# Patient Record
Sex: Male | Born: 1990 | Race: White | Hispanic: No | Marital: Single | Smoking: Never smoker
Health system: Southern US, Community
[De-identification: ages and names within clinical notes are randomized; demographics above are authoritative.]

## PROBLEM LIST (undated history)

## (undated) DIAGNOSIS — G43909 Migraine, unspecified, not intractable, without status migrainosus: Secondary | ICD-10-CM

## (undated) DIAGNOSIS — J309 Allergic rhinitis, unspecified: Secondary | ICD-10-CM

## (undated) HISTORY — DX: Migraine, unspecified, not intractable, without status migrainosus: G43.909

## (undated) HISTORY — DX: Allergic rhinitis, unspecified: J30.9

---
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
## 2013-12-09 ENCOUNTER — Telehealth: Payer: Self-pay | Admitting: Internal Medicine

## 2013-12-09 NOTE — Telephone Encounter (Signed)
Rec'd from [facility] forward 4 pages  to Dr. John °

## 2014-07-09 ENCOUNTER — Encounter: Payer: Self-pay | Admitting: Internal Medicine

## 2014-07-09 ENCOUNTER — Other Ambulatory Visit (INDEPENDENT_AMBULATORY_CARE_PROVIDER_SITE_OTHER): Payer: Managed Care, Other (non HMO)

## 2014-07-09 ENCOUNTER — Ambulatory Visit (INDEPENDENT_AMBULATORY_CARE_PROVIDER_SITE_OTHER): Payer: Managed Care, Other (non HMO) | Admitting: Internal Medicine

## 2014-07-09 ENCOUNTER — Ambulatory Visit (INDEPENDENT_AMBULATORY_CARE_PROVIDER_SITE_OTHER)
Admission: RE | Admit: 2014-07-09 | Discharge: 2014-07-09 | Disposition: A | Discharge status: Home / Self Care | Payer: Managed Care, Other (non HMO) | Source: Ambulatory Visit | Attending: Internal Medicine | Admitting: Internal Medicine

## 2014-07-09 VITALS — BP 124/84 | HR 52 | Temp 98.0°F | Ht 70.0 in | Wt 177.8 lb

## 2014-07-09 DIAGNOSIS — Z0189 Encounter for other specified special examinations: Secondary | ICD-10-CM

## 2014-07-09 DIAGNOSIS — M25571 Pain in right ankle and joints of right foot: Secondary | ICD-10-CM | POA: Insufficient documentation

## 2014-07-09 DIAGNOSIS — Z0000 Encounter for general adult medical examination without abnormal findings: Secondary | ICD-10-CM | POA: Diagnosis not present

## 2014-07-09 LAB — URINALYSIS, ROUTINE W REFLEX MICROSCOPIC
BILIRUBIN URINE: NEGATIVE
Hgb urine dipstick: NEGATIVE
Ketones, ur: NEGATIVE
Leukocytes, UA: NEGATIVE
Nitrite: NEGATIVE
PH: 6 (ref 5.0–8.0)
RBC / HPF: NONE SEEN (ref 0–?)
Specific Gravity, Urine: <=1.005 — AB (ref 1.000–1.030)
Total Protein, Urine: NEGATIVE
URINE GLUCOSE: NEGATIVE
UROBILINOGEN UA: 0.2 (ref 0.0–1.0)
WBC UA: NONE SEEN (ref 0–?)

## 2014-07-09 LAB — CBC WITH DIFFERENTIAL/PLATELET
BASOS ABS: 0.1 10*3/uL (ref 0.0–0.1)
Basophils Relative: 0.9 % (ref 0.0–3.0)
EOS PCT: 1.6 % (ref 0.0–5.0)
Eosinophils Absolute: 0.1 10*3/uL (ref 0.0–0.7)
HCT: 44 % (ref 39.0–52.0)
HEMOGLOBIN: 15.4 g/dL (ref 13.0–17.0)
LYMPHS PCT: 17.9 % (ref 12.0–46.0)
Lymphs Abs: 1.3 10*3/uL (ref 0.7–4.0)
MCHC: 35 g/dL (ref 30.0–36.0)
MCV: 87.1 fl (ref 78.0–100.0)
MONO ABS: 0.5 10*3/uL (ref 0.1–1.0)
MONOS PCT: 6.9 % (ref 3.0–12.0)
NEUTROS ABS: 5.4 10*3/uL (ref 1.4–7.7)
Neutrophils Relative %: 72.7 % (ref 43.0–77.0)
PLATELETS: 257 10*3/uL (ref 150.0–400.0)
RBC: 5.05 Mil/uL (ref 4.22–5.81)
RDW: 12.5 % (ref 11.5–15.5)
WBC: 7.5 10*3/uL (ref 4.0–10.5)

## 2014-07-09 LAB — LIPID PANEL
Cholesterol: 193 mg/dL (ref 0–200)
HDL: 59.4 mg/dL (ref >39.00)
LDL CALC: 107 mg/dL — AB (ref 0–99)
NONHDL: 133.6
Total CHOL/HDL Ratio: 3
Triglycerides: 134 mg/dL (ref 0.0–149.0)
VLDL: 26.8 mg/dL (ref 0.0–40.0)

## 2014-07-09 LAB — HEPATIC FUNCTION PANEL
ALBUMIN: 4.8 g/dL (ref 3.5–5.2)
ALK PHOS: 69 U/L (ref 39–117)
ALT: 25 U/L (ref 0–53)
AST: 30 U/L (ref 0–37)
Bilirubin, Direct: 0.1 mg/dL (ref 0.0–0.3)
Total Bilirubin: 0.7 mg/dL (ref 0.2–1.2)
Total Protein: 7.8 g/dL (ref 6.0–8.3)

## 2014-07-09 LAB — BASIC METABOLIC PANEL
BUN: 17 mg/dL (ref 6–23)
CO2: 30 meq/L (ref 19–32)
Calcium: 9.9 mg/dL (ref 8.4–10.5)
Chloride: 99 mEq/L (ref 96–112)
Creatinine, Ser: 0.95 mg/dL (ref 0.40–1.50)
GFR: 105.35 mL/min (ref >60.00)
Glucose, Bld: 93 mg/dL (ref 70–99)
POTASSIUM: 3.8 meq/L (ref 3.5–5.1)
SODIUM: 135 meq/L (ref 135–145)

## 2014-07-09 LAB — TSH: TSH: 3.07 u[IU]/mL (ref 0.35–4.50)

## 2014-07-09 NOTE — Assessment & Plan Note (Signed)
Ok for right ankle film - r/o avulsion fx

## 2014-07-09 NOTE — Patient Instructions (Signed)
Please continue all other medications as before, and refills have been done if requested.  Please have the pharmacy call with any other refills you may need.  Please continue your efforts at being more active, low cholesterol diet, and weight control.  You are otherwise up to date with prevention measures today.  Please keep your appointments with your specialists as you may have planned  Please go to the XRAY Department in the Basement (go straight as you get off the elevator) for the x-ray testing  Please go to the LAB in the Basement (turn left off the elevator) for the tests to be done today  You will be contacted by phone if any changes need to be made immediately.  Otherwise, you will receive a letter about your results with an explanation, but please check with MyChart first.  Please remember to sign up for MyChart if you have not done so, as this will be important to you in the future with finding out test results, communicating by private email, and scheduling acute appointments online when needed.  

## 2014-07-09 NOTE — Progress Notes (Signed)
Pre visit review using our clinic review tool, if applicable. No additional management support is needed unless otherwise documented below in the visit note. 

## 2014-07-09 NOTE — Assessment & Plan Note (Signed)

## 2014-07-09 NOTE — Progress Notes (Signed)
Subjective:    Patient ID: Jonetta SpeakNicholas R Devora, male    DOB: 02/24/1991, 24 y.o.   MRN: [id number]  HPI  Here for wellness and f/u;  Overall doing ok;  Pt denies Chest pain, worsening SOB, DOE, wheezing, orthopnea, PND, worsening LE edema, palpitations, dizziness or syncope.  Pt denies neurological change such as new headache, facial or extremity weakness.  Pt denies polydipsia, polyuria, or low sugar symptoms. Pt states overall good compliance with treatment and medications, good tolerability, and has been trying to follow appropriate diet.  Pt denies worsening depressive symptoms, suicidal ideation or panic. No fever, night sweats, wt loss, loss of appetite, or other constitutional symptoms.  Pt states good ability with ADL's, has low fall risk, home safety reviewed and adequate, no other significant changes in hearing or vision, and very active with exercise.  Incidentally sprained right ankle x 6 days, still with makred pain and bruising, swelling, decrased ROM.  Leaving may 23 for [place] hiking trip.  Needs titers Hep B and Varicella for completion of matriculation to [employer] in the fall. Past Medical History  Diagnosis Date  . Asthma   . Migraines   . Allergic rhinitis, cause unspecified   . Cellulitis, toe, left 07/12/2011   No past surgical history on file.  reports that he has never smoked. He has never used smokeless tobacco. He reports that he does not drink alcohol or use illicit drugs. family history includes Cancer in his other and other; Hyperlipidemia in his other; Hypertension in his other. Allergies  Allergen Reactions  . Penicillins Rash    Hive when he was a baby   No current outpatient prescriptions on file prior to visit.   No current facility-administered medications on file prior to visit.   Review of Systems Constitutional: Negative for increased diaphoresis, other activity, appetite or siginficant weight change other than noted HENT: Negative for worsening  hearing loss, ear pain, facial swelling, mouth sores and neck stiffness.   Eyes: Negative for other worsening pain, redness or visual disturbance.  Respiratory: Negative for shortness of breath and wheezing  Cardiovascular: Negative for chest pain and palpitations.  Gastrointestinal: Negative for diarrhea, blood in stool, abdominal distention or other pain Genitourinary: Negative for hematuria, flank pain or change in urine volume.  Musculoskeletal: Negative for myalgias or other joint complaints.  Skin: Negative for color change and wound or drainage.  Neurological: Negative for syncope and numbness. other than noted Hematological: Negative for adenopathy. or other swelling Psychiatric/Behavioral: Negative for hallucinations, SI, self-injury, decreased concentration or other worsening agitation.      Objective:   Physical Exam BP 124/84 mmHg  Pulse 52  Temp(Src) 98 F (36.7 C) (Oral)  Ht 5\' 10"  (1.778 m)  Wt 177 lb 12 oz (80.627 kg)  BMI 25.50 kg/m2  SpO2 99% VS noted,  Constitutional: Pt is oriented to person, place, and time. Appears well-developed and well-nourished, in no significant distress Head: Normocephalic and atraumatic.  Right Ear: External ear normal.  Left Ear: External ear normal.  Nose: Nose normal.  Mouth/Throat: Oropharynx is clear and moist.  Eyes: Conjunctivae and EOM are normal. Pupils are equal, round, and reactive to light.  Neck: Normal range of motion. Neck supple. No JVD present. No tracheal deviation present or significant neck LA or mass Cardiovascular: Normal rate, regular rhythm, normal heart sounds and intact distal pulses.   Pulmonary/Chest: Effort normal and breath sounds without rales or wheezing  Abdominal: Soft. Bowel sounds are normal. NT.  No HSM  Musculoskeletal: Normal range of motion. Exhibits no edema.  Lymphadenopathy:  Has no cervical adenopathy.  Neurological: Pt is alert and oriented to person, place, and time. Pt has normal reflexes.  No cranial nerve deficit. Motor grossly intact Skin: Skin is warm and dry. No rash noted.  Psychiatric:  Has normal mood and affect. Behavior is normal.  Right ankle with 2+ swelling/brusiing, tender, decraesd ROm o/w neurovasc intact    Assessment & Plan:

## 2014-07-11 LAB — VARICELLA ZOSTER ANTIBODY, IGG: Varicella IgG: 112.5 Index (ref <135.00)

## 2014-07-13 ENCOUNTER — Ambulatory Visit (INDEPENDENT_AMBULATORY_CARE_PROVIDER_SITE_OTHER): Payer: Managed Care, Other (non HMO) | Admitting: *Deleted

## 2014-07-13 DIAGNOSIS — Z23 Encounter for immunization: Secondary | ICD-10-CM

## 2014-07-15 ENCOUNTER — Encounter: Payer: Self-pay | Admitting: *Deleted

## 2014-07-15 LAB — TB SKIN TEST
Induration: 0 mm
TB Skin Test: NEGATIVE

## 2014-07-23 ENCOUNTER — Encounter: Payer: Self-pay | Admitting: Internal Medicine

## 2014-07-23 DIAGNOSIS — Z0189 Encounter for other specified special examinations: Secondary | ICD-10-CM

## 2014-07-30 NOTE — Telephone Encounter (Signed)
Ok for dahlia to make sure lab is entered correctly, thanks

## 2014-08-02 NOTE — Telephone Encounter (Signed)
Entered Hep order...Raechel Chute/lmb

## 2014-08-02 NOTE — Addendum Note (Signed)
Addended by: Deatra JamesBRAND, Giovany Cosby M on: 08/02/2014 09:59 AM   Modules accepted: Orders

## 2014-09-06 ENCOUNTER — Telehealth: Payer: Self-pay | Admitting: Internal Medicine

## 2014-09-06 NOTE — Telephone Encounter (Signed)
Patient is scheduled for varicella and 2nd tb on Thursday.  Patient will also be doing labs.  Mother would like to make sure that this is what he will need for school or if anything else needs to be added.

## 2014-09-06 NOTE — Telephone Encounter (Signed)
Pt advised to review paperwork for school and to inform office if anything additional as required

## 2014-09-09 ENCOUNTER — Other Ambulatory Visit: Payer: Managed Care, Other (non HMO)

## 2014-09-09 ENCOUNTER — Ambulatory Visit: Payer: Managed Care, Other (non HMO)

## 2014-09-09 ENCOUNTER — Telehealth: Payer: Self-pay

## 2014-09-09 DIAGNOSIS — Z0189 Encounter for other specified special examinations: Secondary | ICD-10-CM

## 2014-09-09 LAB — HEPATITIS B SURFACE ANTIBODY,QUALITATIVE: HEP B S AB: NEGATIVE

## 2014-09-09 NOTE — Progress Notes (Signed)
   Subjective:    Patient ID: Casey Wells, male    DOB: 06/28/1990, 24 y.o.   MRN: [id number]  HPI    Review of Systems     Objective:   Physical Exam        Assessment & Plan:

## 2014-09-09 NOTE — Telephone Encounter (Signed)
A user error has taken place.

## 2014-09-09 NOTE — Progress Notes (Signed)
Patient stated he needed 2 step tb test, hep b titer and varicella vaccine---while patient was here for nurse visit, i advised that we do not have varicella vaccine but to contact the health dept to see if he can obtain there--also advised that tb tests cannot begin on Thursdays or fridays because the result has to be read 2 days after administering and we are not here on saturdays---patient advised to reschedule with tb test on either Monday, Tuesday or Wednesday---also order was already in by pcp for patient to go to lab and get hep b titer performed, patient was advised to go to basement for that lab draw---also patient advised that he should call back to reschedule nurse visit for either Monday, Tuesday or Wednesday to have 1st tb done, read 2 days later, and then make another nurse visit appt for 2 weeks later in order to complete the 2 step tb test (another tb will have to be performed for a 2 step tb test)

## 2014-09-12 ENCOUNTER — Telehealth: Payer: Managed Care, Other (non HMO) | Admitting: Family

## 2014-09-12 DIAGNOSIS — R197 Diarrhea, unspecified: Secondary | ICD-10-CM

## 2014-09-12 MED ORDER — CIPROFLOXACIN HCL 500 MG PO TABS: 500.0000 mg | ORAL_TABLET | Freq: Two times a day (BID) | ORAL | Status: DC

## 2014-09-12 MED ORDER — CIPROFLOXACIN HCL 500 MG PO TABS
500.0000 mg | ORAL_TABLET | Freq: Two times a day (BID) | ORAL | Status: AC
Start: 2014-09-12 — End: ?

## 2014-09-12 NOTE — Progress Notes (Signed)
We are sorry that you are not feeling well.  Here is how we plan to help!  Based on what you have shared with me it looks like you have Acute Infectious Diarrhea.  Most cases of acute diarrhea are due to infections with virus and bacteria and are self-limited conditions lasting less than 14 days.  For your symptoms you may take Imodium 2 mg tablets that are over the counter at your local pharmacy. Take two tablet now and then one after each loose stool up to 6 a day.  Antibiotics are not needed for most people with diarrhea.   I have sent in Cipro 500 mg  tablets twice a day for five days to [facility] in [place].  HOME CARE  We recommend changing your diet to help with your symptoms for the next few days.  Drink plenty of fluids that contain water salt and sugar. Sports drinks such as Gatorade may help.   You may try broths, soups, bananas, applesauce, soft breads, mashed potatoes or crackers.   You are considered infectious for as long as the diarrhea continues. Hand washing or use of alcohol based hand sanitizers is recommend.  It is best to stay out of work or school until your symptoms stop.   GET HELP RIGHT AWAY  If you have dark yellow colored urine or do not pass urine frequently you should drink more fluids.    If your symptoms worsen   If you feel like you are going to pass out (faint)  You have a new problem  MAKE SURE YOU   Understand these instructions.  Will watch your condition.  Will get help right away if you are not doing well or get worse.  Your e-visit answers were reviewed by a board certified advanced clinical practitioner to complete your personal care plan.  Depending on the condition, your plan could have included both over the counter or prescription medications.  If there is a problem please reply  once you have received a response from your provider.  Your safety is important to us.  If you have drug allergies check your prescription carefully.     You can use MyChart to ask questions about today's visit, request a non-urgent call back, or ask for a work or school excuse.  You will get an e-mail in the next two days asking about your experience.  I hope that your e-visit has been valuable and will speed your recovery. Thank you for using e-visits.

## 2014-09-12 NOTE — Addendum Note (Signed)
Addended by: Jannifer RodneyHAWKS, Thomson Herbers A on: 09/12/2014 06:56 PM   Modules accepted: Orders

## 2014-09-14 ENCOUNTER — Ambulatory Visit: Payer: Self-pay | Admitting: Internal Medicine

## 2016-10-18 IMAGING — CR DG ANKLE COMPLETE 3+V*R*
3 series · 3 of 3 positions shown · non-contrast
Comparison: None.

CLINICAL DATA: Ankle sprain. Basketball injury. Initial evaluation
.

EXAM:
RIGHT ANKLE - COMPLETE 3+ VIEW

[view not recorded (1 of 3)]
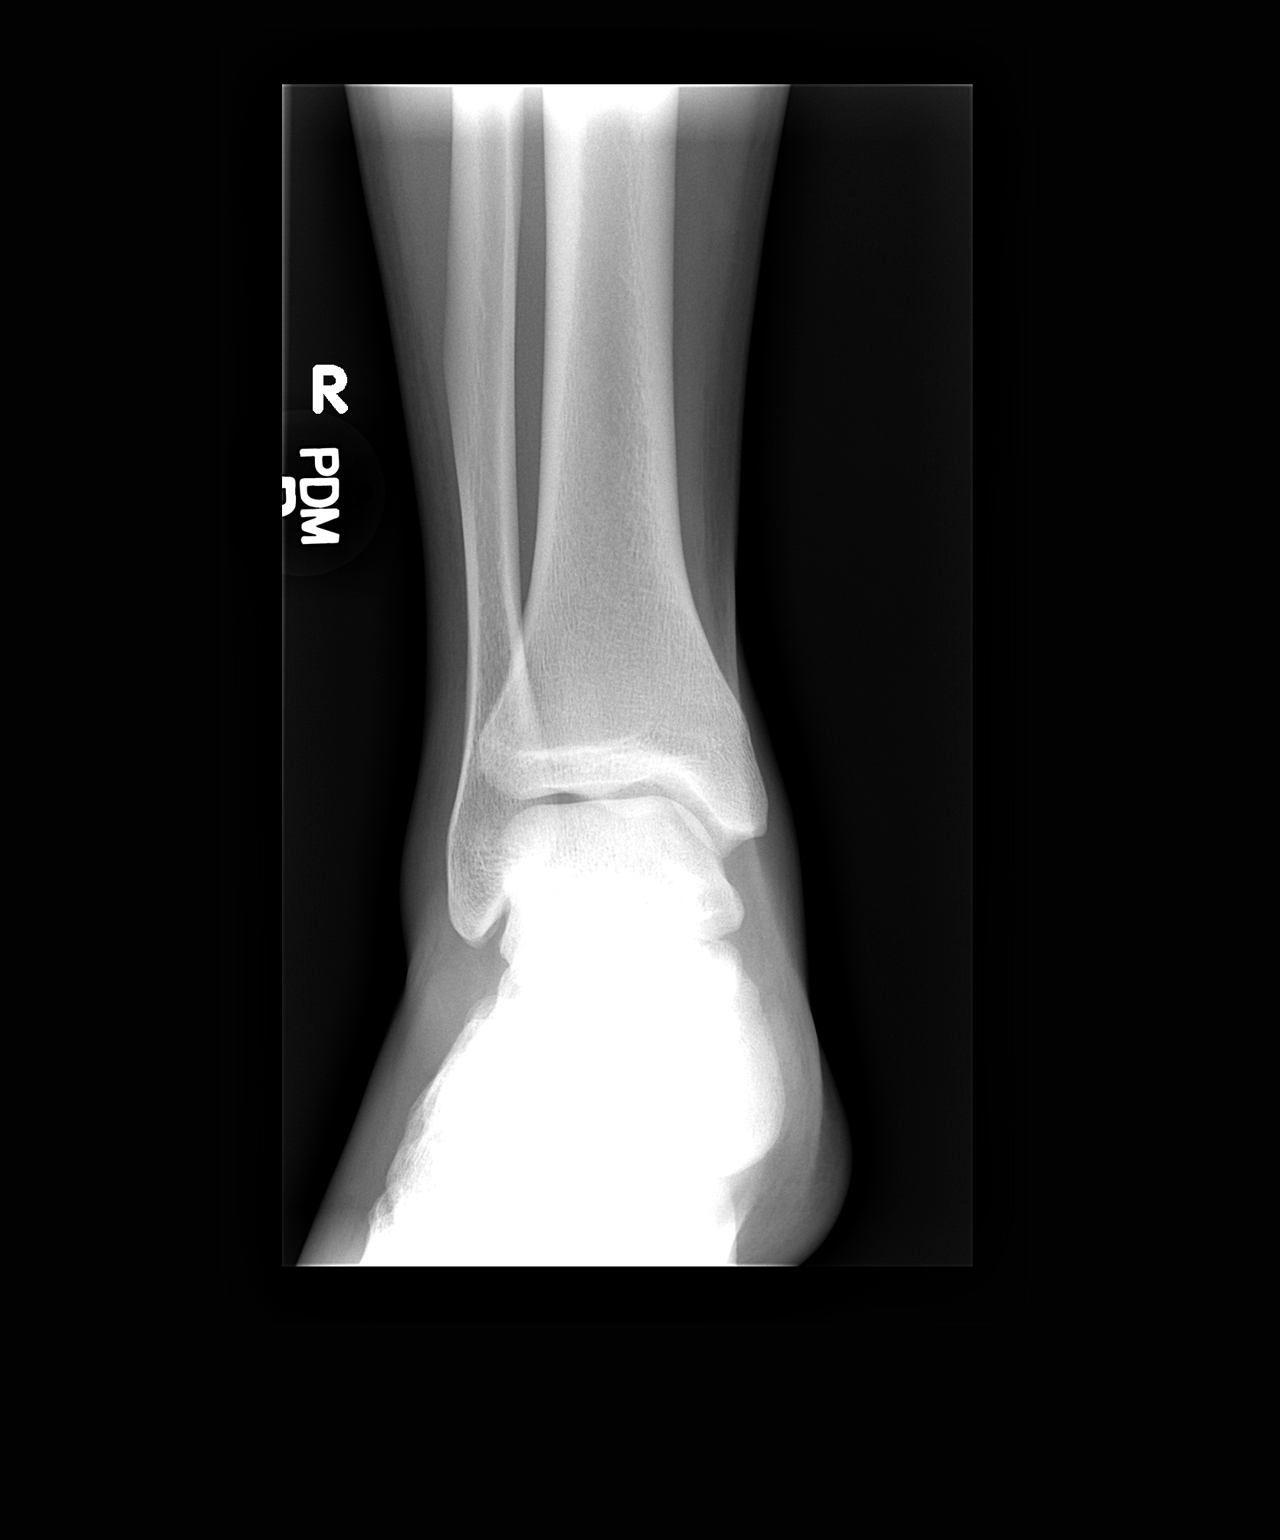

[view not recorded (2 of 3)]
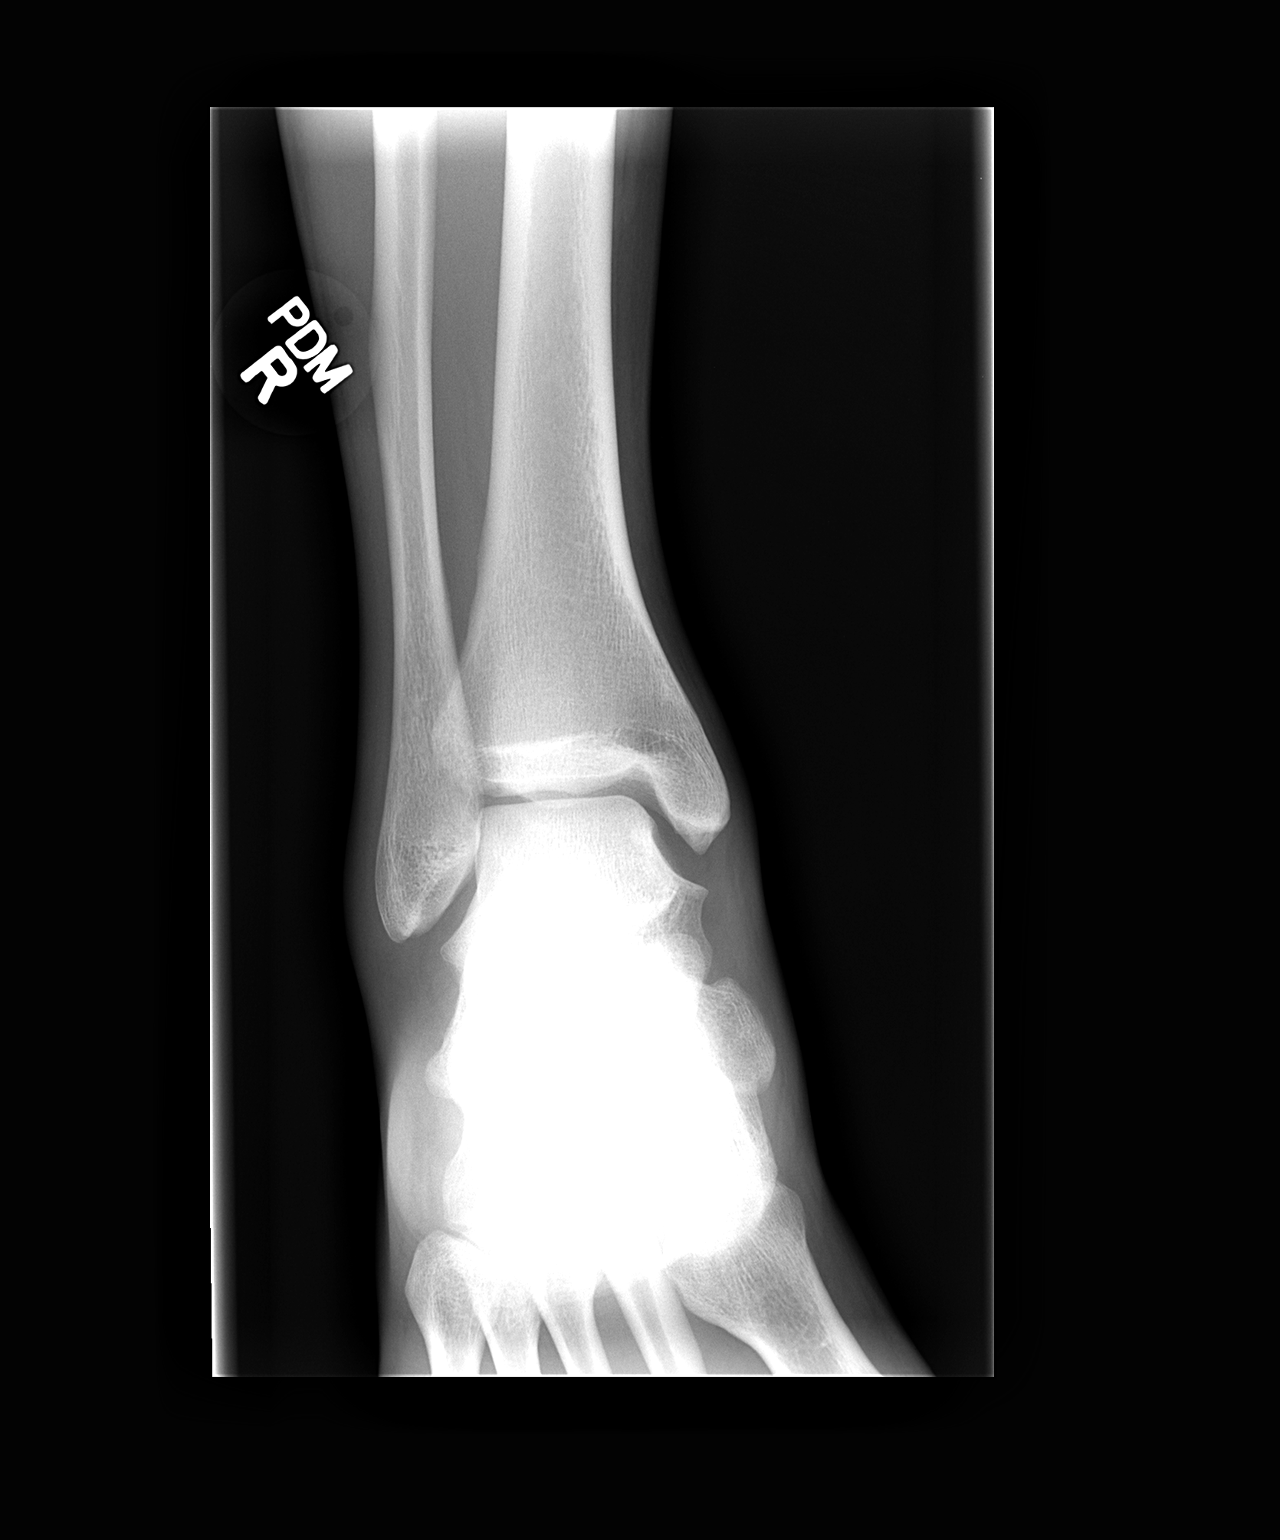

[view not recorded (3 of 3)]
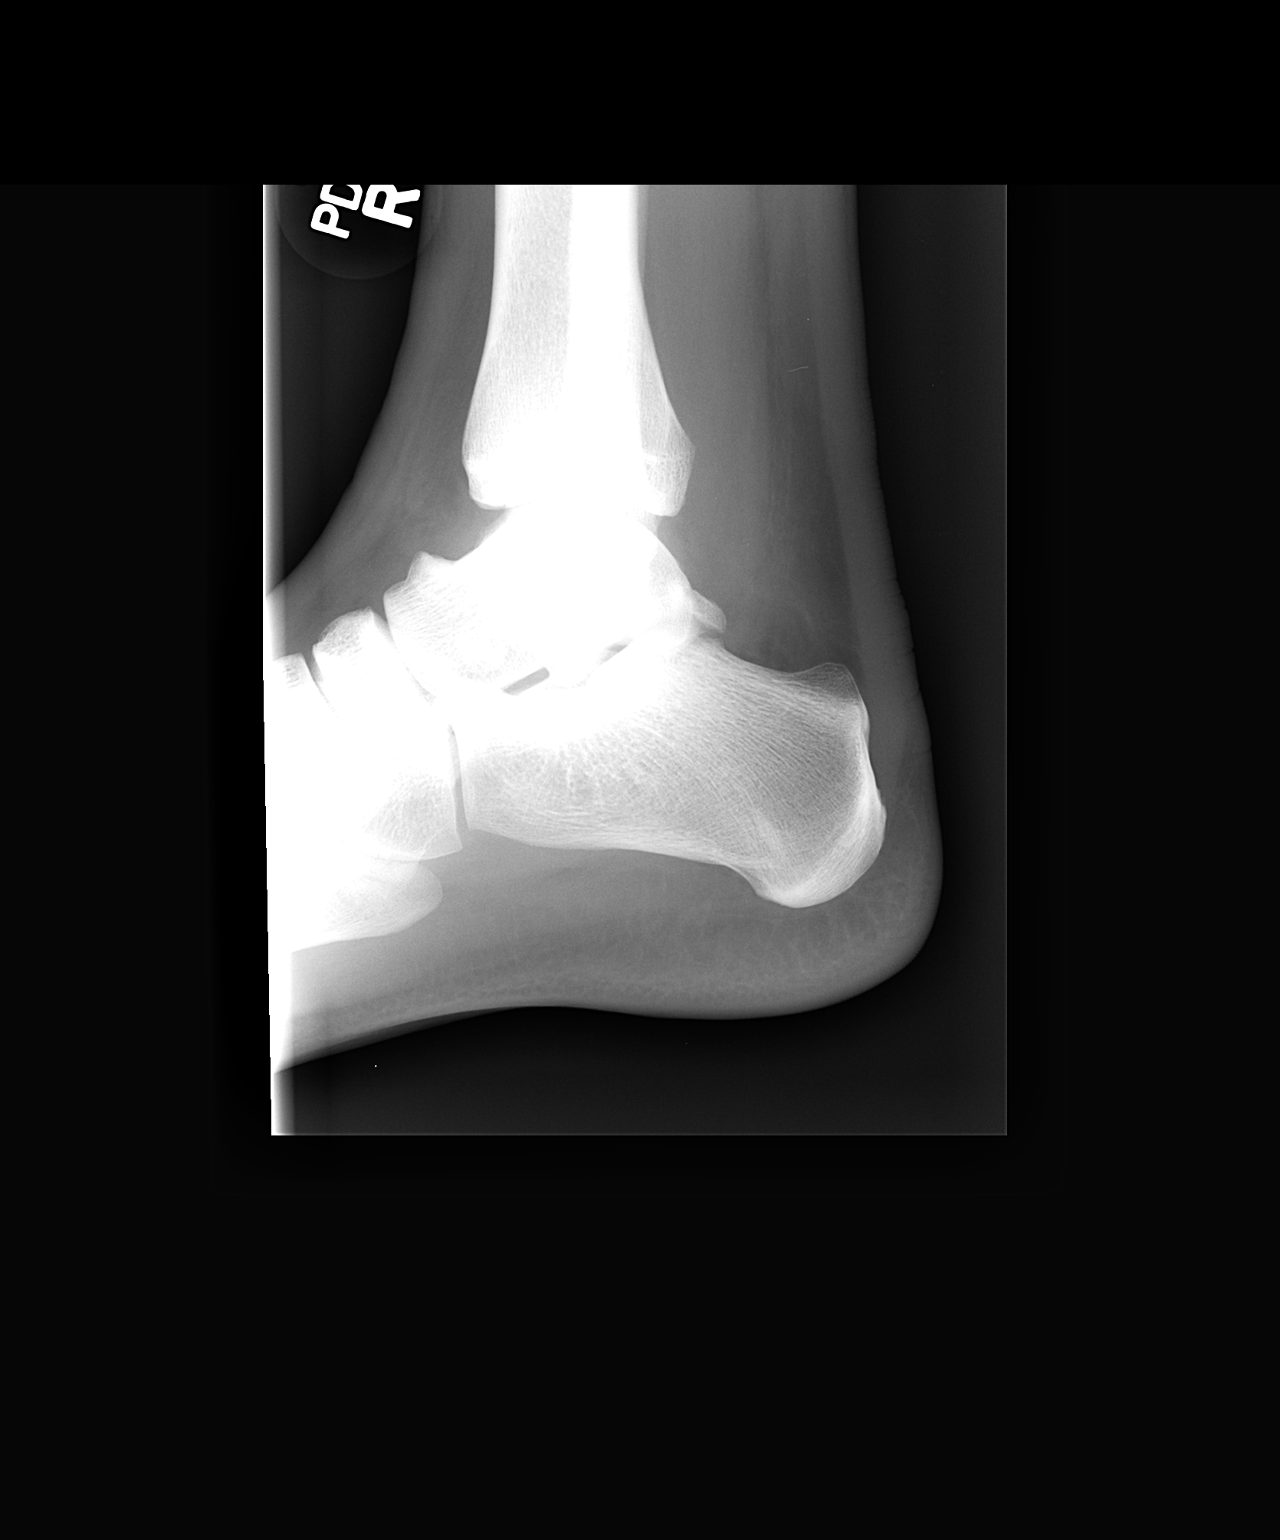

[3 of 3 positions shown; findings below may reference images not displayed]

FINDINGS: No acute bony or joint abnormality identified. No evidence of
fracture dislocation.
IMPRESSION: No acute or focal abnormality.
